# Patient Record
Sex: Female | Born: 1990 | Race: Black or African American | Hispanic: No | Marital: Single | Smoking: Never smoker
Health system: Southern US, Community
[De-identification: ages and names within clinical notes are randomized; demographics above are authoritative.]

## PROBLEM LIST (undated history)

## (undated) DIAGNOSIS — J45909 Unspecified asthma, uncomplicated: Secondary | ICD-10-CM

## (undated) DIAGNOSIS — R569 Unspecified convulsions: Secondary | ICD-10-CM

---

## 2015-11-28 ENCOUNTER — Encounter (HOSPITAL_COMMUNITY): Payer: Self-pay | Admitting: Emergency Medicine

## 2015-11-28 ENCOUNTER — Emergency Department (HOSPITAL_COMMUNITY): Payer: Self-pay

## 2015-11-28 ENCOUNTER — Emergency Department (HOSPITAL_COMMUNITY)
Admission: EM | Admit: 2015-11-28 | Discharge: 2015-11-28 | Disposition: A | Discharge status: Home / Self Care | Payer: Self-pay | Attending: Emergency Medicine | Admitting: Emergency Medicine

## 2015-11-28 DIAGNOSIS — R0981 Nasal congestion: Secondary | ICD-10-CM | POA: Insufficient documentation

## 2015-11-28 DIAGNOSIS — R059 Cough, unspecified: Secondary | ICD-10-CM

## 2015-11-28 DIAGNOSIS — Z79899 Other long term (current) drug therapy: Secondary | ICD-10-CM | POA: Insufficient documentation

## 2015-11-28 DIAGNOSIS — R112 Nausea with vomiting, unspecified: Secondary | ICD-10-CM | POA: Insufficient documentation

## 2015-11-28 DIAGNOSIS — M791 Myalgia: Secondary | ICD-10-CM | POA: Insufficient documentation

## 2015-11-28 DIAGNOSIS — R05 Cough: Secondary | ICD-10-CM

## 2015-11-28 DIAGNOSIS — R519 Headache, unspecified: Secondary | ICD-10-CM

## 2015-11-28 DIAGNOSIS — R51 Headache: Secondary | ICD-10-CM | POA: Insufficient documentation

## 2015-11-28 DIAGNOSIS — R52 Pain, unspecified: Secondary | ICD-10-CM

## 2015-11-28 LAB — BASIC METABOLIC PANEL
Anion gap: 8 (ref 5–15)
BUN: 13 mg/dL (ref 6–20)
CO2: 20 mmol/L — ABNORMAL LOW (ref 22–32)
Calcium: 8.6 mg/dL — ABNORMAL LOW (ref 8.9–10.3)
Chloride: 105 mmol/L (ref 101–111)
Creatinine, Ser: 0.88 mg/dL (ref 0.44–1.00)
GFR calc Af Amer: >60 mL/min (ref >60)
GFR calc non Af Amer: >60 mL/min (ref >60)
Glucose, Bld: 86 mg/dL (ref 65–99)
Potassium: 3.9 mmol/L (ref 3.5–5.1)
Sodium: 133 mmol/L — ABNORMAL LOW (ref 135–145)

## 2015-11-28 LAB — URINALYSIS, ROUTINE W REFLEX MICROSCOPIC
Bilirubin Urine: NEGATIVE
Glucose, UA: NEGATIVE mg/dL
Hgb urine dipstick: NEGATIVE
Ketones, ur: NEGATIVE mg/dL
Nitrite: NEGATIVE
Protein, ur: NEGATIVE mg/dL
Specific Gravity, Urine: 1.012 (ref 1.005–1.030)
pH: 7 (ref 5.0–8.0)

## 2015-11-28 LAB — CBC WITH DIFFERENTIAL/PLATELET
Basophils Absolute: 0 10*3/uL (ref 0.0–0.1)
Basophils Relative: 0 %
Eosinophils Absolute: 0.2 10*3/uL (ref 0.0–0.7)
Eosinophils Relative: 2 %
HCT: 36.1 % (ref 36.0–46.0)
Hemoglobin: 12.3 g/dL (ref 12.0–15.0)
Lymphocytes Relative: 15 %
Lymphs Abs: 1.5 10*3/uL (ref 0.7–4.0)
MCH: 27 pg (ref 26.0–34.0)
MCHC: 34.1 g/dL (ref 30.0–36.0)
MCV: 79.2 fL (ref 78.0–100.0)
Monocytes Absolute: 0.6 10*3/uL (ref 0.1–1.0)
Monocytes Relative: 6 %
Neutro Abs: 7.7 10*3/uL (ref 1.7–7.7)
Neutrophils Relative %: 77 %
Platelets: 262 10*3/uL (ref 150–400)
RBC: 4.56 MIL/uL (ref 3.87–5.11)
RDW: 14.6 % (ref 11.5–15.5)
WBC: 10.1 10*3/uL (ref 4.0–10.5)

## 2015-11-28 LAB — URINE MICROSCOPIC-ADD ON: RBC / HPF: NONE SEEN RBC/hpf (ref 0–5)

## 2015-11-28 LAB — LIPASE, BLOOD: Lipase: 19 U/L (ref 11–51)

## 2015-11-28 LAB — PREGNANCY, URINE: Preg Test, Ur: NEGATIVE

## 2015-11-28 MED ORDER — PROCHLORPERAZINE EDISYLATE 5 MG/ML IJ SOLN
5.0000 mg | Freq: Once | INTRAMUSCULAR | Status: AC
  Administered 2015-11-28: 5 mg via INTRAVENOUS
  Filled 2015-11-28: qty 2

## 2015-11-28 MED ORDER — BENZONATATE 100 MG PO CAPS: 100.0000 mg | ORAL_CAPSULE | Freq: Three times a day (TID) | ORAL | Status: AC

## 2015-11-28 MED ORDER — KETOROLAC TROMETHAMINE 30 MG/ML IJ SOLN
30.0000 mg | Freq: Once | INTRAMUSCULAR | Status: AC
  Administered 2015-11-28: 30 mg via INTRAVENOUS
  Filled 2015-11-28: qty 1

## 2015-11-28 MED ORDER — ONDANSETRON HCL 4 MG PO TABS
4.0000 mg | ORAL_TABLET | Freq: Four times a day (QID) | ORAL | Status: AC
Start: 2015-11-28 — End: ?

## 2015-11-28 MED ORDER — DIPHENHYDRAMINE HCL 50 MG/ML IJ SOLN
25.0000 mg | Freq: Once | INTRAMUSCULAR | Status: AC
  Administered 2015-11-28: 25 mg via INTRAVENOUS
  Filled 2015-11-28: qty 1

## 2015-11-28 MED ORDER — CETIRIZINE HCL 10 MG PO TABS: 10.0000 mg | ORAL_TABLET | Freq: Every day | ORAL | Status: AC

## 2015-11-28 MED ORDER — SODIUM CHLORIDE 0.9 % IV BOLUS (SEPSIS)
1000.0000 mL | Freq: Once | INTRAVENOUS | Status: AC
  Administered 2015-11-28: 1000 mL via INTRAVENOUS

## 2015-11-28 NOTE — Discharge Instructions (Signed)
Medications: Zofran, Tessalon, Zyrtec  Treatment: Take Zofran every 6 hours as needed for nausea and vomiting. Take Tessalon every 8 hours as needed for cough. Takes Zyrtec daily as prescribed. He can take Tylenol or ibuprofen as prescribed on the packaging for your headaches. Get plenty of rest. Drink 8 glasses of water daily. When you feel up to eating, began eating bland foods such as bananas, rice, applesauce, toast, baked chicken.  Follow-up: Please follow-up with the primary care provider listed in your discharge paperwork to establish care and as needed if your symptoms do not resolve in one week. Please return to emergency department if you're unable to tolerate your medications or keep down fluids, or if you develop any other new or concerning symptoms.   Nausea and Vomiting Nausea is a sick feeling that often comes before throwing up (vomiting). Vomiting is a reflex where stomach contents come out of your mouth. Vomiting can cause severe loss of body fluids (dehydration). Children and elderly adults can become dehydrated quickly, especially if they also have diarrhea. Nausea and vomiting are symptoms of a condition or disease. It is important to find the cause of your symptoms. CAUSES   Direct irritation of the stomach lining. This irritation can result from increased acid production (gastroesophageal reflux disease), infection, food poisoning, taking certain medicines (such as nonsteroidal anti-inflammatory drugs), alcohol use, or tobacco use.  Signals from the brain.These signals could be caused by a headache, heat exposure, an inner ear disturbance, increased pressure in the brain from injury, infection, a tumor, or a concussion, pain, emotional stimulus, or metabolic problems.  An obstruction in the gastrointestinal tract (bowel obstruction).  Illnesses such as diabetes, hepatitis, gallbladder problems, appendicitis, kidney problems, cancer, sepsis, atypical symptoms of a heart  attack, or eating disorders.  Medical treatments such as chemotherapy and radiation.  Receiving medicine that makes you sleep (general anesthetic) during surgery. DIAGNOSIS Your caregiver may ask for tests to be done if the problems do not improve after a few days. Tests may also be done if symptoms are severe or if the reason for the nausea and vomiting is not clear. Tests may include:  Urine tests.  Blood tests.  Stool tests.  Cultures (to look for evidence of infection).  X-rays or other imaging studies. Test results can help your caregiver make decisions about treatment or the need for additional tests. TREATMENT You need to stay well hydrated. Drink frequently but in small amounts.You may wish to drink water, sports drinks, clear broth, or eat frozen ice pops or gelatin dessert to help stay hydrated.When you eat, eating slowly may help prevent nausea.There are also some antinausea medicines that may help prevent nausea. HOME CARE INSTRUCTIONS   Take all medicine as directed by your caregiver.  If you do not have an appetite, do not force yourself to eat. However, you must continue to drink fluids.  If you have an appetite, eat a normal diet unless your caregiver tells you differently.  Eat a variety of complex carbohydrates (rice, wheat, potatoes, bread), lean meats, yogurt, fruits, and vegetables.  Avoid high-fat foods because they are more difficult to digest.  Drink enough water and fluids to keep your urine clear or pale yellow.  If you are dehydrated, ask your caregiver for specific rehydration instructions. Signs of dehydration may include:  Severe thirst.  Dry lips and mouth.  Dizziness.  Dark urine.  Decreasing urine frequency and amount.  Confusion.  Rapid breathing or pulse. SEEK IMMEDIATE MEDICAL CARE IF:  You have blood or brown flecks (like coffee grounds) in your vomit.  You have black or bloody stools.  You have a severe headache or stiff  neck.  You are confused.  You have severe abdominal pain.  You have chest pain or trouble breathing.  You do not urinate at least once every 8 hours.  You develop cold or clammy skin.  You continue to vomit for longer than 24 to 48 hours.  You have a fever. MAKE SURE YOU:   Understand these instructions.  Will watch your condition.  Will get help right away if you are not doing well or get worse.   This information is not intended to replace advice given to you by your health care provider. Make sure you discuss any questions you have with your health care provider.   Document Released: 07/09/2005 Document Revised: 10/01/2011 Document Reviewed: 12/06/2010 Elsevier Interactive Patient Education [employer]2016 [employer] Inc.

## 2015-11-28 NOTE — ED Notes (Signed)
PT not in rm unable to collect labs

## 2015-11-28 NOTE — ED Notes (Signed)
Bed: [id number] Expected date:  Expected time:  Means of arrival:  Comments: EMS/26F/bodyaches

## 2015-11-28 NOTE — ED Provider Notes (Signed)
CSN: [id number]     Arrival date & time 11/28/15  [id number] History   First MD Initiated Contact with Patient 11/28/15 1011     Chief Complaint  Patient presents with  . flu like symptoms      (Consider location/radiation/quality/duration/timing/severity/associated sxs/prior Treatment) HPI Comments: Patient is a 25 year old female with history of asthma who presents with 2 day history of nausea, vomiting, cough, nasal congestion, generalized body aches, headache. Patient states she has had 3 episodes of nonbloody vomiting the past 2 days. Patient has been able to tolerate some food and has had good fluid intake. Patient has had decreased appetite. She reports generalized abdominal pain. She has had a tactile fever intermittently. Patient patient reports her headache as a 10/10. States it is worsened when coughing. Patient also reports a chest pain when she coughs. Patient has not tried any medications at home. Patient denies shortness of breath, diarrhea, dysuria.  The history is provided by the patient.    History reviewed. No pertinent past medical history. History reviewed. No pertinent past surgical history. No family history on file. Social History  Substance Use Topics  . Smoking status: Never Smoker   . Smokeless tobacco: None  . Alcohol Use: Yes   OB History    No data available     Review of Systems  Constitutional: Negative for fever and chills.  HENT: Positive for congestion. Negative for facial swelling.   Respiratory: Positive for cough. Negative for shortness of breath.   Cardiovascular: Negative for chest pain.  Gastrointestinal: Positive for nausea, vomiting and abdominal pain. Negative for diarrhea.  Genitourinary: Negative for dysuria.  Musculoskeletal: Positive for myalgias. Negative for back pain, neck pain and neck stiffness.  Skin: Negative for rash and wound.  Neurological: Positive for headaches.  Psychiatric/Behavioral: The patient is not nervous/anxious.        Allergies  Shellfish allergy  Home Medications   Prior to Admission medications   Medication Sig Start Date End Date Taking? Authorizing Provider  benzonatate (TESSALON) 100 MG capsule Take 1 capsule (100 mg total) by mouth every 8 (eight) hours. 11/28/15   Emi HolesAlexandra M Prosperity Darrough, PA-C  cetirizine (ZYRTEC ALLERGY) 10 MG tablet Take 1 tablet (10 mg total) by mouth daily. 11/28/15   Ceylin Dreibelbis M Yoseph Haile, PA-C  ondansetron (ZOFRAN) 4 MG tablet Take 1 tablet (4 mg total) by mouth every 6 (six) hours. 11/28/15   Nakyra Bourn M Keairra Bardon, PA-C   BP 118/83 mmHg  Pulse 62  Temp(Src) 98.1 F (36.7 C) (Oral)  Resp 18  SpO2 98%  LMP  (LMP Unknown) Physical Exam  Constitutional: She appears well-developed and well-nourished. No distress.  HENT:  Head: Normocephalic and atraumatic.  Mouth/Throat: Oropharynx is clear and moist. No oropharyngeal exudate.  Eyes: Conjunctivae and EOM are normal. Pupils are equal, round, and reactive to light. Right eye exhibits no discharge. Left eye exhibits no discharge. No scleral icterus.  Neck: Normal range of motion. Neck supple. No thyromegaly present.  Cardiovascular: Normal rate, regular rhythm, normal heart sounds and intact distal pulses.  Exam reveals no gallop and no friction rub.   No murmur heard. Pulmonary/Chest: Effort normal and breath sounds normal. No stridor. No respiratory distress. She has no wheezes. She has no rales.  Abdominal: Soft. Bowel sounds are normal. She exhibits no distension. There is no tenderness. There is no rebound and no guarding.  Endorse "tightness" on palpation, no focal tenderness, besides chronic post- cesarean lower abdominal tenderness  Musculoskeletal: She exhibits no edema.  Lymphadenopathy:    She has no cervical adenopathy.  Neurological: She is alert. Coordination normal.  CN 3-12 intact, normal sensation throughout, 5/5 strength in all 4 extremities  Skin: Skin is warm and dry. No rash noted. She is not diaphoretic. No pallor.   Psychiatric: She has a normal mood and affect.  Nursing note and vitals reviewed.   ED Course  Procedures (including critical care time) Labs Review Labs Reviewed  URINALYSIS, ROUTINE W REFLEX MICROSCOPIC (NOT AT [facility]) - Abnormal; Notable for the following:    APPearance CLOUDY (*)    Leukocytes, UA TRACE (*)    All other components within normal limits  BASIC METABOLIC PANEL - Abnormal; Notable for the following:    Sodium 133 (*)    CO2 20 (*)    Calcium 8.6 (*)    All other components within normal limits  URINE MICROSCOPIC-ADD ON - Abnormal; Notable for the following:    Squamous Epithelial / LPF 6-30 (*)    Bacteria, UA RARE (*)    All other components within normal limits  CBC WITH DIFFERENTIAL/PLATELET  LIPASE, BLOOD  PREGNANCY, URINE    Imaging Review Dg Chest 2 View  11/28/2015  CLINICAL DATA:  Cough and congestion for 3 days EXAM: CHEST  2 VIEW COMPARISON:  None. FINDINGS: Lungs are clear. Heart size and pulmonary vascularity are normal. No adenopathy. No bone lesions. IMPRESSION: No abnormality noted. Electronically Signed   By: Bretta Bang III M.D.   On: 11/28/2015 11:13   I have personally reviewed and evaluated these images and lab results as part of my medical decision-making.   EKG Interpretation None      MDM   Patient with symptoms consistent with viral syndrome.  Vitals are stable, no fever.  No signs of dehydration, tolerating PO fluids > 6 oz.  Lungs are clear.  No focal abdominal pain, no concern for appendicitis, cholecystitis, pancreatitis, ruptured viscus, UTI, kidney stone, or any other abdominal etiology. CBC unremarkable. BMP shows sodium 133, CO2 20, calcium 8.6. Lipase 19. UA shows trace leukocytes with rare bacteria, most likely contamination for fencing's, cell count. Patient reports no urinary symptoms. Urine pregnancy negative. CXR shows no abnormality. Patient's headache improved with Toradol, Compazine, Benadryl. Supportive therapy  indicated with return if symptoms worsen. Patient encouraged to establish care with PCP indicated on discharge paperwork and to follow-up if symptoms do not resolve. Return precautions given. Patient discussed with Dr. Cyndie Chime who is in agreement with plan. Patient discharged in satisfactory condition.   Final diagnoses:  Non-intractable vomiting with nausea, vomiting of unspecified type  Cough  Body aches  Nasal congestion  Acute nonintractable headache, unspecified headache type       Emi Holes, PA-C 11/28/15 1437  Leta Baptist, MD 12/01/15 [phone]

## 2015-11-28 NOTE — ED Notes (Signed)
Per [facility] pt from home reports body aches, nausea , emesis x 2 days. denies  fever , abd pain nor diarrhea. Also reports cough x 2 days. Lung sounds clear per [facility]. Possible Hx of asthma per pt per [facility]. Pt alert and oriented x 4.

## 2015-12-16 ENCOUNTER — Emergency Department (HOSPITAL_COMMUNITY): Payer: Medicaid - Out of State

## 2015-12-16 ENCOUNTER — Encounter (HOSPITAL_COMMUNITY): Payer: Self-pay | Admitting: Emergency Medicine

## 2015-12-16 ENCOUNTER — Emergency Department (HOSPITAL_COMMUNITY)
Admission: EM | Admit: 2015-12-16 | Discharge: 2015-12-16 | Disposition: A | Discharge status: Home / Self Care | Payer: Medicaid - Out of State | Attending: Emergency Medicine | Admitting: Emergency Medicine

## 2015-12-16 DIAGNOSIS — R1084 Generalized abdominal pain: Secondary | ICD-10-CM | POA: Insufficient documentation

## 2015-12-16 DIAGNOSIS — R102 Pelvic and perineal pain: Secondary | ICD-10-CM

## 2015-12-16 DIAGNOSIS — Z3A01 Less than 8 weeks gestation of pregnancy: Secondary | ICD-10-CM | POA: Insufficient documentation

## 2015-12-16 DIAGNOSIS — O210 Mild hyperemesis gravidarum: Secondary | ICD-10-CM | POA: Diagnosis present

## 2015-12-16 DIAGNOSIS — O26899 Other specified pregnancy related conditions, unspecified trimester: Secondary | ICD-10-CM

## 2015-12-16 DIAGNOSIS — Z79899 Other long term (current) drug therapy: Secondary | ICD-10-CM | POA: Insufficient documentation

## 2015-12-16 DIAGNOSIS — J45909 Unspecified asthma, uncomplicated: Secondary | ICD-10-CM | POA: Insufficient documentation

## 2015-12-16 DIAGNOSIS — R110 Nausea: Secondary | ICD-10-CM

## 2015-12-16 HISTORY — DX: Unspecified asthma, uncomplicated: J45.909

## 2015-12-16 HISTORY — DX: Unspecified convulsions: R56.9

## 2015-12-16 LAB — WET PREP, GENITAL
Sperm: NONE SEEN
Trich, Wet Prep: NONE SEEN
Yeast Wet Prep HPF POC: NONE SEEN

## 2015-12-16 LAB — LIPASE, BLOOD: Lipase: 21 U/L (ref 11–51)

## 2015-12-16 LAB — COMPREHENSIVE METABOLIC PANEL
ALBUMIN: 4.2 g/dL (ref 3.5–5.0)
ALT: 16 U/L (ref 14–54)
ANION GAP: 5 (ref 5–15)
AST: 18 U/L (ref 15–41)
Alkaline Phosphatase: 41 U/L (ref 38–126)
BUN: 7 mg/dL (ref 6–20)
CALCIUM: 8.9 mg/dL (ref 8.9–10.3)
CO2: 24 mmol/L (ref 22–32)
Chloride: 104 mmol/L (ref 101–111)
Creatinine, Ser: 0.77 mg/dL (ref 0.44–1.00)
GFR calc non Af Amer: >60 mL/min (ref >60)
Glucose, Bld: 97 mg/dL (ref 65–99)
POTASSIUM: 3.4 mmol/L — AB (ref 3.5–5.1)
SODIUM: 133 mmol/L — AB (ref 135–145)
TOTAL PROTEIN: 7.3 g/dL (ref 6.5–8.1)
Total Bilirubin: 0.5 mg/dL (ref 0.3–1.2)

## 2015-12-16 LAB — HCG, QUANTITATIVE, PREGNANCY: hCG, Beta Chain, Quant, S: 121394 m[IU]/mL — ABNORMAL HIGH (ref <5)

## 2015-12-16 LAB — CBC
HEMATOCRIT: 35.6 % — AB (ref 36.0–46.0)
HEMOGLOBIN: 12 g/dL (ref 12.0–15.0)
MCH: 27 pg (ref 26.0–34.0)
MCHC: 33.7 g/dL (ref 30.0–36.0)
MCV: 80.2 fL (ref 78.0–100.0)
Platelets: 282 10*3/uL (ref 150–400)
RBC: 4.44 MIL/uL (ref 3.87–5.11)
RDW: 14.5 % (ref 11.5–15.5)
WBC: 10.2 10*3/uL (ref 4.0–10.5)

## 2015-12-16 LAB — I-STAT BETA HCG BLOOD, ED (MC, WL, AP ONLY)

## 2015-12-16 MED ORDER — METOCLOPRAMIDE HCL 10 MG PO TABS: 10.0000 mg | ORAL_TABLET | Freq: Four times a day (QID) | ORAL | Status: AC | PRN

## 2015-12-16 NOTE — ED Notes (Signed)
Called lab, verified that hCG could be added on. Request sent with new lab draw.

## 2015-12-16 NOTE — ED Notes (Signed)
Pt c/o abdominal pain x 1 week, nausea, emesis, loss of sensation of taste. LMP April 7. Positive pregnancy test last week.

## 2015-12-16 NOTE — ED Notes (Signed)
Pt aware urine sample is needed. Pt unable at this time. Pt stated "thats one thing I don't got to do."

## 2015-12-16 NOTE — ED Notes (Signed)
Pt returned from US at this time.

## 2015-12-16 NOTE — ED Notes (Signed)
Pt given discharge instructions, verbalized understanding of need to follow up, medications to take at home, and reasons to return to the ED. Pt denies further questions or concerns. Pt given Rx for nausea medication and information on 24 hour pharmacy. Pt able to ambulate to exit with significant other without any difficulty.

## 2015-12-16 NOTE — ED Notes (Signed)
Patient transported to Ultrasound 

## 2015-12-16 NOTE — ED Notes (Signed)
Spoke with PA and MD. Pt to have blood drawn and US. Pt remains unable to void. Aware of need to provide sample.

## 2015-12-16 NOTE — ED Notes (Signed)
Pt to US at this time.

## 2015-12-16 NOTE — ED Provider Notes (Signed)
CSN: [id number]     Arrival date & time 12/16/15  1756 History   First MD Initiated Contact with Patient 12/16/15 1916     No chief complaint on file.    (Consider location/radiation/quality/duration/timing/severity/associated sxs/prior Treatment) HPI Comments: Patient is a pregnant 25 year old female who presents with nausea and generalized abdominal pain for 1 week. Patient reports she took a pregnancy test 2 days ago that was positive. Patient has had intermittent vomiting 2-3 times daily. Patient can tolerate some food and fluids. There is no blood in her vomit. Patient had one episode of non-bloody diarrhea 2 days ago, which has resolved. Patient states she is also having a white vaginal discharge for the past one week. Patient denies any vaginal bleeding. Patient's LMP was April 7. Patient denies any chest pain, shortness of breath, dysuria.  The history is provided by the patient.    Past Medical History  Diagnosis Date  . Asthma   . Seizures (HCC)    History reviewed. No pertinent past surgical history. History reviewed. No pertinent family history. Social History  Substance Use Topics  . Smoking status: Never Smoker   . Smokeless tobacco: None  . Alcohol Use: Yes   OB History    No data available     Review of Systems  Constitutional: Negative for fever and chills.  HENT: Negative for facial swelling and sore throat.   Respiratory: Negative for shortness of breath.   Cardiovascular: Negative for chest pain.  Gastrointestinal: Positive for nausea, vomiting and abdominal pain.  Genitourinary: Negative for dysuria.  Musculoskeletal: Negative for back pain.  Skin: Negative for rash and wound.  Neurological: Negative for headaches.  Psychiatric/Behavioral: The patient is not nervous/anxious.       Allergies  Shellfish allergy  Home Medications   Prior to Admission medications   Medication Sig Start Date End Date Taking? Authorizing Provider  benzonatate  (TESSALON) 100 MG capsule Take 1 capsule (100 mg total) by mouth every 8 (eight) hours. Patient not taking: Reported on 12/16/2015 11/28/15   Emi Holes, PA-C  cetirizine (ZYRTEC ALLERGY) 10 MG tablet Take 1 tablet (10 mg total) by mouth daily. Patient not taking: Reported on 12/16/2015 11/28/15   Emi Holes, PA-C  metoCLOPramide (REGLAN) 10 MG tablet Take 1 tablet (10 mg total) by mouth every 6 (six) hours as needed for nausea. 12/16/15   Claudene Gatliff M Annisten Manchester, PA-C  ondansetron (ZOFRAN) 4 MG tablet Take 1 tablet (4 mg total) by mouth every 6 (six) hours. Patient not taking: Reported on 12/16/2015 11/28/15   Waylan Boga Nneka Blanda, PA-C   BP 119/77 mmHg  Pulse 83  Temp(Src) 98.2 F (36.8 C) (Oral)  Resp 16  SpO2 100%  LMP 10/28/2015 Physical Exam  Constitutional: She is oriented to person, place, and time. She appears well-developed and well-nourished. No distress.  HENT:  Head: Normocephalic and atraumatic.  Mouth/Throat: Oropharynx is clear and moist. No oropharyngeal exudate.  Eyes: Conjunctivae are normal. Pupils are equal, round, and reactive to light. Right eye exhibits no discharge. Left eye exhibits no discharge. No scleral icterus.  Neck: Normal range of motion. Neck supple. No thyromegaly present.  Cardiovascular: Normal rate, regular rhythm, normal heart sounds and intact distal pulses.  Exam reveals no gallop and no friction rub.   No murmur heard. Pulmonary/Chest: Effort normal and breath sounds normal. No stridor. No respiratory distress. She has no wheezes. She has no rales.  Abdominal: Soft. Bowel sounds are normal. She exhibits no distension. There is  tenderness (mild) in the suprapubic area. There is no rebound and no guarding.  Genitourinary: There is no rash, tenderness or lesion on the right labia. There is no rash, tenderness or lesion on the left labia. Cervix exhibits motion tenderness and discharge. Right adnexum displays no mass, no tenderness and no fullness. Left adnexum  displays no mass, no tenderness and no fullness. No erythema or bleeding in the vagina. No foreign body around the vagina. Vaginal discharge (white, milky) found.  Patient stated she felt irritation during pelvic exam, cervical motion tenderness, and suprapubic tenderness on bimanual exam  Musculoskeletal: She exhibits no edema.  Lymphadenopathy:    She has no cervical adenopathy.  Neurological: She is alert and oriented to person, place, and time. Coordination normal.  Skin: Skin is warm and dry. No rash noted. She is not diaphoretic. No pallor.  Psychiatric: She has a normal mood and affect. Her behavior is normal. Judgment and thought content normal.  Nursing note and vitals reviewed.   ED Course  Procedures (including critical care time) Labs Review Labs Reviewed  WET PREP, GENITAL - Abnormal; Notable for the following:    Clue Cells Wet Prep HPF POC PRESENT (*)    WBC, Wet Prep HPF POC MANY (*)    All other components within normal limits  COMPREHENSIVE METABOLIC PANEL - Abnormal; Notable for the following:    Sodium 133 (*)    Potassium 3.4 (*)    All other components within normal limits  CBC - Abnormal; Notable for the following:    HCT 35.6 (*)    All other components within normal limits  I-STAT BETA HCG BLOOD, ED ([facility], [facility], [facility] ONLY) - Abnormal; Notable for the following:    I-stat hCG, quantitative >2000.0 (*)    All other components within normal limits  URINE CULTURE  LIPASE, BLOOD  URINALYSIS, ROUTINE W REFLEX MICROSCOPIC (NOT AT [facility])  HIV ANTIBODY (ROUTINE TESTING)  RPR  HCG, QUANTITATIVE, PREGNANCY  GC/CHLAMYDIA PROBE AMP ([employer]) NOT AT [facility]    Imaging Review US Ob Comp Less 14 Wks  12/16/2015  CLINICAL DATA:  Acute onset of pelvic pain and vomiting. Initial encounter. EXAM: OBSTETRIC <14 WK [place] AND TRANSVAGINAL OB US TECHNIQUE: Both transabdominal and transvaginal ultrasound examinations were performed for complete evaluation of the gestation as well as  the maternal uterus, adnexal regions, and pelvic cul-de-sac. Transvaginal technique was performed to assess early pregnancy. COMPARISON:  None. FINDINGS: Intrauterine gestational sac: Single; visualized and normal in shape. Yolk sac:  No Embryo:  No Cardiac Activity: N/A MSD: 2.35 cm   7 w   2  d Subchorionic hemorrhage: A small amount of subchorionic hemorrhage is noted. Maternal uterus/adnexae: The uterus is otherwise unremarkable in appearance. The ovaries are not visualized on this study. No free fluid is seen within the pelvic cul-de-sac. IMPRESSION: 1. Single intrauterine gestational sac noted, with a mean sac diameter of 2.4 cm, corresponding to a gestational age of [redacted] weeks 2 days. This matches the gestational age of [redacted] weeks 0 days by LMP, reflecting an estimated delivery of August 03, 2016. No yolk sac or embryo is yet seen. This is suspicious for nonviability, though not diagnostic. Would perform follow-up pelvic ultrasound in 2 weeks, if the patient's quantitative beta HCG continues to rise. 2. Small amount of subchorionic hemorrhage noted. Electronically Signed   By: Roanna Raider M.D.   On: 12/16/2015 21:34   US Ob Transvaginal  12/16/2015  CLINICAL DATA:  Acute onset of  pelvic pain and vomiting. Initial encounter. EXAM: OBSTETRIC <14 WK US AND TRANSVAGINAL OB US TECHNIQUE: Both transabdominal and transvaginal ultrasound examinations were performed for complete evaluation of the gestation as well as the maternal uterus, adnexal regions, and pelvic cul-de-sac. Transvaginal technique was performed to assess early pregnancy. COMPARISON:  None. FINDINGS: Intrauterine gestational sac: Single; visualized and normal in shape. Yolk sac:  No Embryo:  No Cardiac Activity: N/A MSD: 2.35 cm   7 w   2  d Subchorionic hemorrhage: A small amount of subchorionic hemorrhage is noted. Maternal uterus/adnexae: The uterus is otherwise unremarkable in appearance. The ovaries are not visualized on this study. No free  fluid is seen within the pelvic cul-de-sac. IMPRESSION: 1. Single intrauterine gestational sac noted, with a mean sac diameter of 2.4 cm, corresponding to a gestational age of [redacted] weeks 2 days. This matches the gestational age of [redacted] weeks 0 days by LMP, reflecting an estimated delivery of August 03, 2016. No yolk sac or embryo is yet seen. This is suspicious for nonviability, though not diagnostic. Would perform follow-up pelvic ultrasound in 2 weeks, if the patient's quantitative beta HCG continues to rise. 2. Small amount of subchorionic hemorrhage noted. Electronically Signed   By: Roanna RaiderJeffery  Chang M.D.   On: 12/16/2015 21:34   I have personally reviewed and evaluated these images and lab results as part of my medical decision-making.   EKG Interpretation None      MDM   CBC shows HCT 35.6. CMP shows sodium 133, potassium 3.4. Beta hCG > 2000. HCG Quant pending. Wet prep shows clue cells and many WBCs. GC chlamydia cultures sent. HIV, RPR sent. Patient made aware that she will be called in 2-3 days if any of her results return positive. She is to be treated at the health department or women's outpatient clinic if any of her results are positive. Transvaginal ultrasound shows Single intrauterine gestational sac noted, with a mean sacdiameter of 2.4 cm, corresponding to a gestational age of [redacted] weeks 2 days. This matches the gestational age of [redacted] weeks 0 days by LMP, reflecting an estimated delivery of August 03, 2016. No yolk sac or embryo is yet seen. This is suspicious for nonviability, though not diagnostic. Recommendation to perform follow-up pelvic ultrasound in 2 weeks, if the patient's quantitative beta HCG continues to rise; Small amount of subchorionic hemorrhage noted. Per discussion with Dr. Lynelle DoctorKnapp, will not treat patient for STD or PID at this time and have patient follow up with OB/GYN. Follow-up with OB/GYN on Monday for recheck of hCG and further evaluation and treatment. Patient discussed with  Dr. Lynelle DoctorKnapp who is in agreement with plan. Patient vitals stable throughout ED course and discharged in satisfactory condition.  Final diagnoses:  Nausea  Pelvic pain during pregnancy  Generalized abdominal pain       Emi Holeslexandra M Carman Auxier, PA-C 12/16/15 2214  Linwood DibblesJon Knapp, MD 12/17/15 1327

## 2015-12-16 NOTE — Discharge Instructions (Signed)
Medications: Reglan  Treatment: Take Reglan as prescribed every 6 hours as needed for nausea and vomiting. You will be called in 2-3 days if any of your cultures and blood tests return POSITIVE. Please follow-up with the health department or your OB/GYN for treatment if any of your test returned positive.  Follow-up: Please follow-up with the women's outpatient clinic on Monday for recheck of your hormone levels and further evaluation and treatment. You will need a repeat pelvic ultrasound in 2 weeks for follow-up. Please the [facility] emergency department if you develop any new or worsening symptoms related to pregnancy.   Abdominal Pain During Pregnancy Abdominal pain is common in pregnancy. Most of the time, it does not cause harm. There are many causes of abdominal pain. Some causes are more serious than others. Some of the causes of abdominal pain in pregnancy are easily diagnosed. Occasionally, the diagnosis takes time to understand. Other times, the cause is not determined. Abdominal pain can be a sign that something is very wrong with the pregnancy, or the pain may have nothing to do with the pregnancy at all. For this reason, always tell your health care provider if you have any abdominal discomfort. HOME CARE INSTRUCTIONS  Monitor your abdominal pain for any changes. The following actions may help to alleviate any discomfort you are experiencing:  Do not have sexual intercourse or put anything in your vagina until your symptoms go away completely.  Get plenty of rest until your pain improves.  Drink clear fluids if you feel nauseous. Avoid solid food as long as you are uncomfortable or nauseous.  Only take over-the-counter or prescription medicine as directed by your health care provider.  Keep all follow-up appointments with your health care provider. SEEK IMMEDIATE MEDICAL CARE IF:  You are bleeding, leaking fluid, or passing tissue from the vagina.  You have increasing  pain or cramping.  You have persistent vomiting.  You have painful or bloody urination.  You have a fever.  You notice a decrease in your baby's movements.  You have extreme weakness or feel faint.  You have shortness of breath, with or without abdominal pain.  You develop a severe headache with abdominal pain.  You have abnormal vaginal discharge with abdominal pain.  You have persistent diarrhea.  You have abdominal pain that continues even after rest, or gets worse. MAKE SURE YOU:   Understand these instructions.  Will watch your condition.  Will get help right away if you are not doing well or get worse.   This information is not intended to replace advice given to you by your health care provider. Make sure you discuss any questions you have with your health care provider.   Document Released: 07/09/2005 Document Revised: 04/29/2013 Document Reviewed: 02/05/2013 [employer] Interactive Patient Education [employer]

## 2015-12-16 NOTE — ED Notes (Signed)
RN sts she will draw add on labs.

## 2015-12-17 LAB — RPR: RPR Ser Ql: NONREACTIVE

## 2015-12-17 LAB — HIV ANTIBODY (ROUTINE TESTING W REFLEX): HIV Screen 4th Generation wRfx: NONREACTIVE

## 2015-12-20 LAB — GC/CHLAMYDIA PROBE AMP (~~LOC~~) NOT AT ARMC
Chlamydia: NEGATIVE
Neisseria Gonorrhea: NEGATIVE

## 2016-09-05 IMAGING — CR DG CHEST 2V
2 series · 2 of 2 positions shown · non-contrast
Comparison: None.

CLINICAL DATA: Cough and congestion for 3 days

EXAM:
CHEST  2 VIEW

[w chest pa]
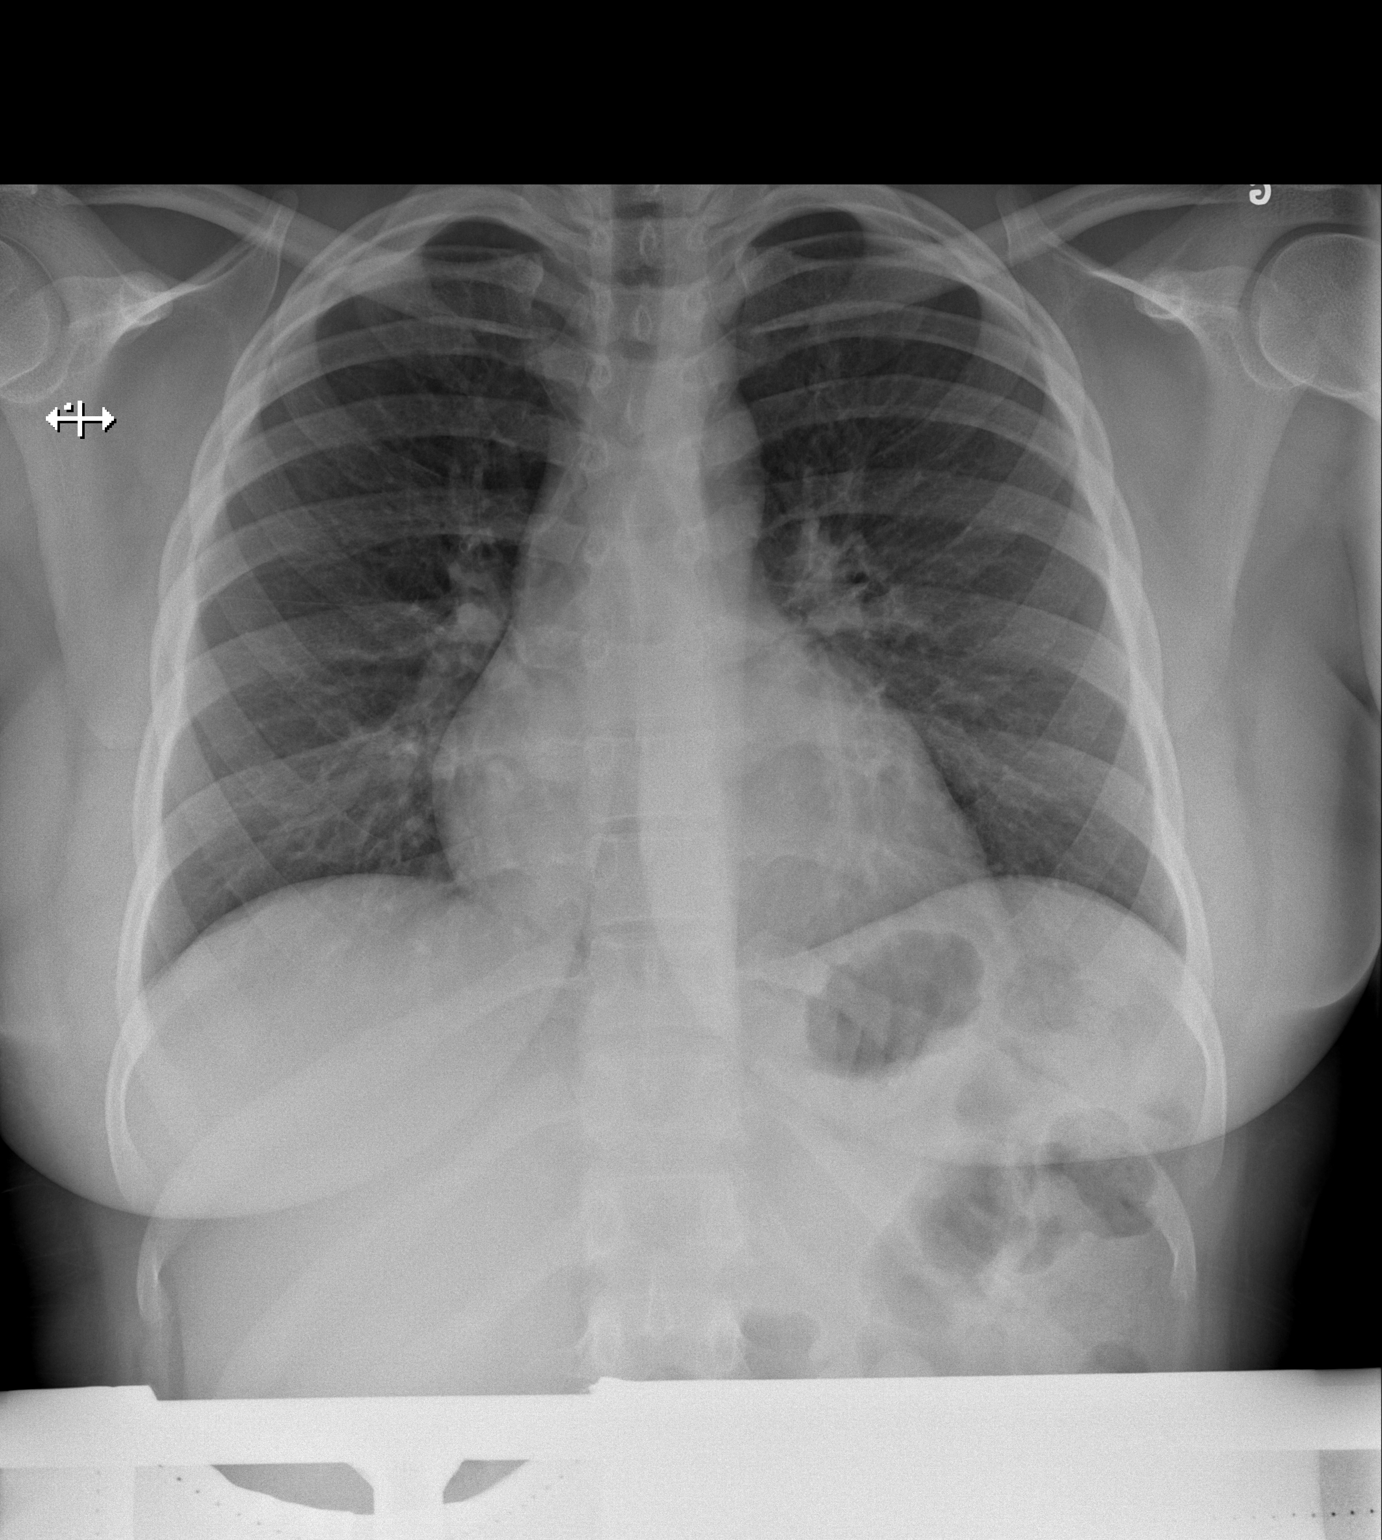

[w chest lat]
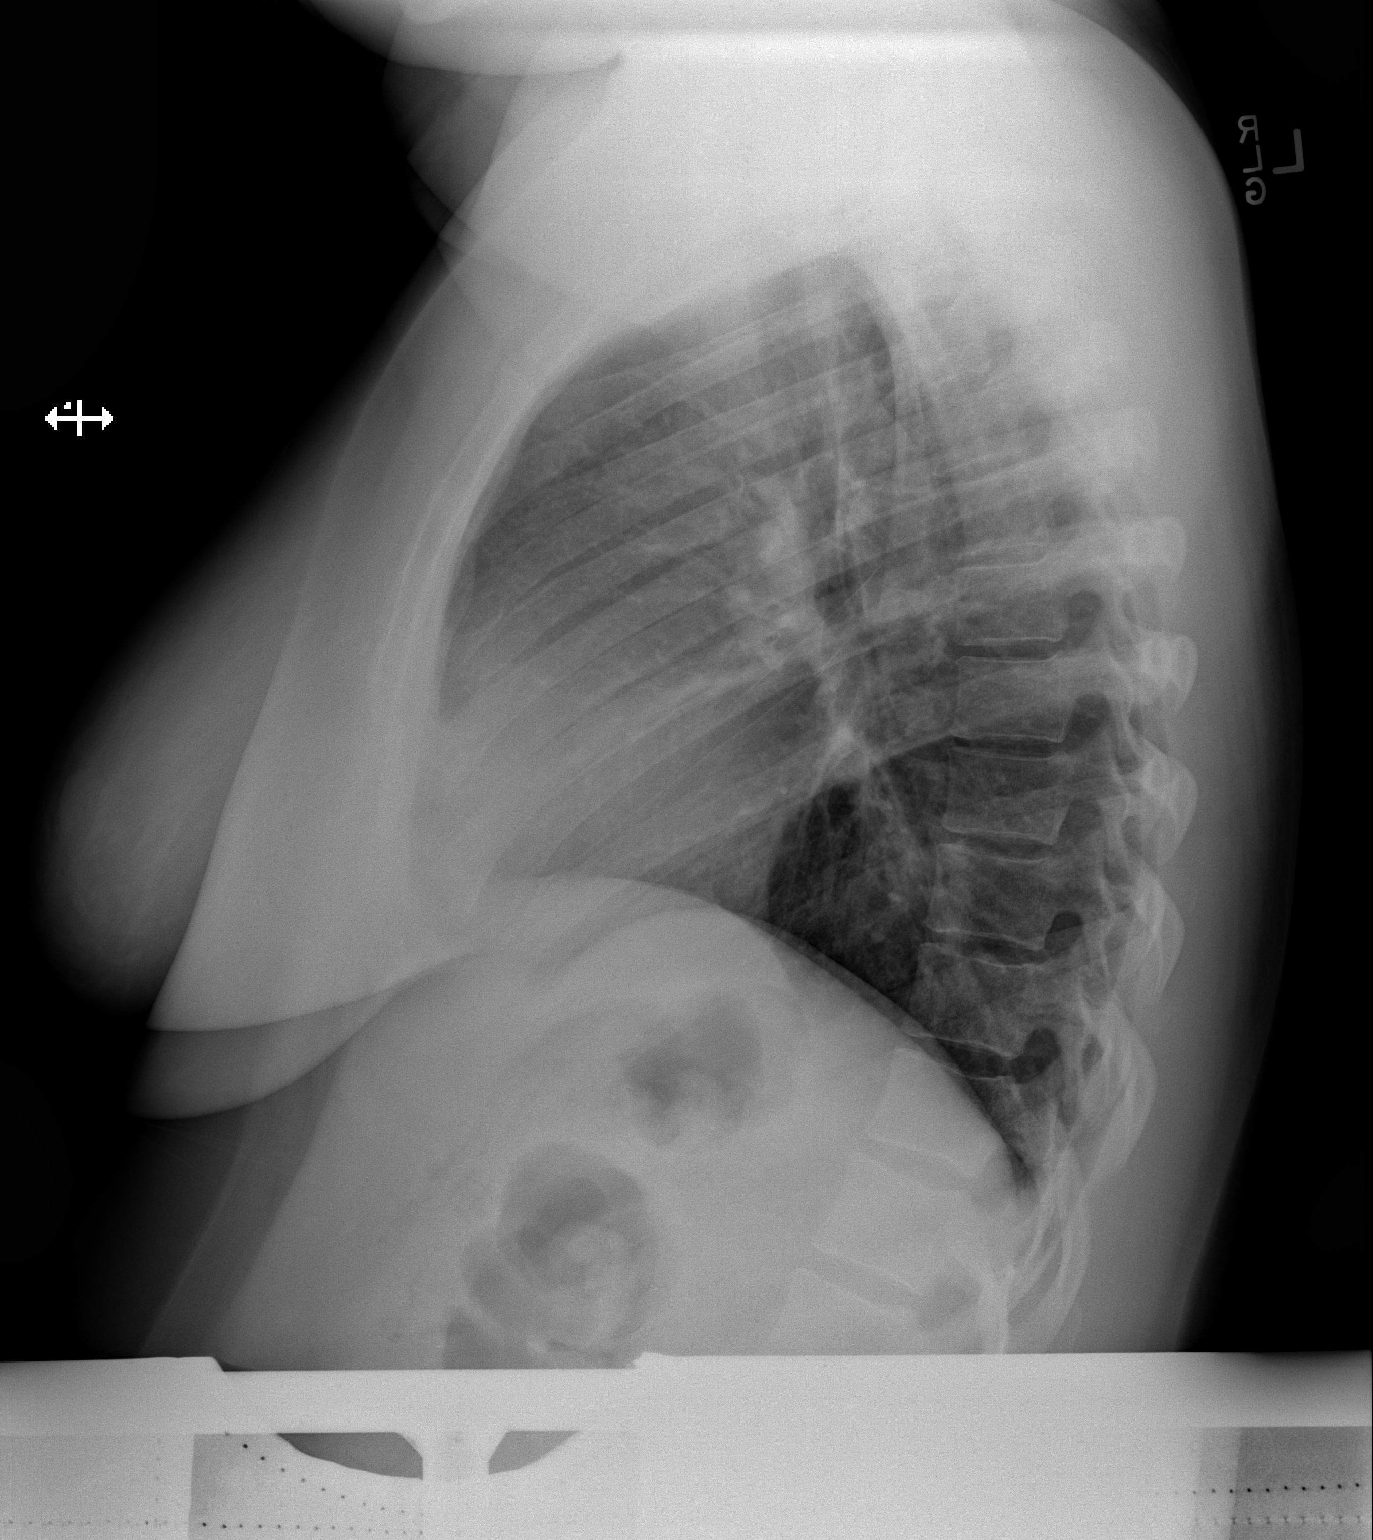

[2 of 2 positions shown; findings below may reference images not displayed]

FINDINGS: Lungs are clear. Heart size and pulmonary vascularity are normal. No
adenopathy. No bone lesions.
IMPRESSION: No abnormality noted.
